# Patient Record
Sex: Male | Born: 1970 | Race: White | Hispanic: No | Marital: Single | Smoking: Former smoker
Health system: Southern US, Community
[De-identification: ages and names within clinical notes are randomized; demographics above are authoritative.]

## PROBLEM LIST (undated history)

## (undated) HISTORY — PX: KNEE SURGERY: SHX244

---

## 2008-11-12 ENCOUNTER — Emergency Department (HOSPITAL_COMMUNITY): Admission: EM | Admit: 2008-11-12 | Discharge: 2008-11-12 | Payer: Self-pay | Admitting: Emergency Medicine

## 2011-03-12 ENCOUNTER — Ambulatory Visit (INDEPENDENT_AMBULATORY_CARE_PROVIDER_SITE_OTHER): Payer: Self-pay | Admitting: Family Medicine

## 2011-03-12 ENCOUNTER — Encounter: Payer: Self-pay | Admitting: Family Medicine

## 2011-03-12 VITALS — BP 120/80 | HR 82 | Wt 185.0 lb

## 2011-03-12 DIAGNOSIS — F411 Generalized anxiety disorder: Secondary | ICD-10-CM

## 2011-03-12 DIAGNOSIS — F419 Anxiety disorder, unspecified: Secondary | ICD-10-CM

## 2011-03-12 MED ORDER — MIRTAZAPINE 7.5 MG PO TABS: 7.5000 mg | ORAL_TABLET | Freq: Every day | ORAL | Status: AC

## 2011-03-12 NOTE — Progress Notes (Signed)
  Subjective:    Patient ID: Allen Benson, male    DOB: 1970-11-26, 40 y.o.   MRN: [id number]  HPI He is here for consult concerning difficulty with anxiety. He relates difficulty with anxiety dating back to high school. He is also had panic attacks. He admits to having difficulty occasionally with sleep disturbance. There does seem to be a slightly cyclic nature to this. He states that he has a hard time shutting his mind off and this sometimes interferes with sleep. He sometimes only 3 or 4 hours of sleep and then crashed. He has a hard time staying focused. In the past he had tried cocaine and found that it did occasionally make him more focused. In the past he had been given Remeron which she did say helped with sleep.   Review of Systems     Objective:   Physical Exam Alert and in no distress. Birthmark present on upper mid lip. Multiple tattoos are noted. He is appropriately dressed.       Assessment & Plan:  Symptoms suggestive of anxiety as well as anger management issues. There is a question of bipolar disorder. I will start him out initially with low-dose Remeron to see how this will do. He is aware that his diagnosis is not clear-cut. He will call me in one month and let me know he is doing.

## 2011-03-12 NOTE — Patient Instructions (Signed)
Try the Remeron and let me know how it works. Call me in a week

## 2011-04-16 ENCOUNTER — Encounter (HOSPITAL_COMMUNITY): Payer: Self-pay | Admitting: Radiology

## 2011-04-16 ENCOUNTER — Emergency Department (HOSPITAL_COMMUNITY)
Admission: EM | Admit: 2011-04-16 | Discharge: 2011-04-16 | Disposition: A | Discharge status: Home / Self Care | Payer: Self-pay | Attending: Emergency Medicine | Admitting: Emergency Medicine

## 2011-04-16 ENCOUNTER — Emergency Department (HOSPITAL_COMMUNITY): Payer: Self-pay

## 2011-04-16 DIAGNOSIS — K5289 Other specified noninfective gastroenteritis and colitis: Secondary | ICD-10-CM | POA: Insufficient documentation

## 2011-04-16 DIAGNOSIS — R109 Unspecified abdominal pain: Secondary | ICD-10-CM | POA: Insufficient documentation

## 2011-04-16 LAB — URINE MICROSCOPIC-ADD ON

## 2011-04-16 LAB — BASIC METABOLIC PANEL
CO2: 23 mEq/L (ref 19–32)
Chloride: 103 mEq/L (ref 96–112)
Creatinine, Ser: 0.96 mg/dL (ref 0.50–1.35)
Glucose, Bld: 103 mg/dL — ABNORMAL HIGH (ref 70–99)

## 2011-04-16 LAB — URINALYSIS, ROUTINE W REFLEX MICROSCOPIC
Glucose, UA: NEGATIVE mg/dL
Hgb urine dipstick: NEGATIVE
Protein, ur: 30 mg/dL — AB
pH: 6 (ref 5.0–8.0)

## 2011-04-16 LAB — CBC
HCT: 44.4 % (ref 39.0–52.0)
Hemoglobin: 15.2 g/dL (ref 13.0–17.0)
MCH: 31 pg (ref 26.0–34.0)
MCHC: 34.2 g/dL (ref 30.0–36.0)
RBC: 4.9 MIL/uL (ref 4.22–5.81)

## 2011-04-16 LAB — DIFFERENTIAL
Lymphocytes Relative: 21 % (ref 12–46)
Lymphs Abs: 1.5 10*3/uL (ref 0.7–4.0)
Monocytes Absolute: 0.7 10*3/uL (ref 0.1–1.0)
Monocytes Relative: 9 % (ref 3–12)
Neutro Abs: 5.2 10*3/uL (ref 1.7–7.7)
Neutrophils Relative %: 70 % (ref 43–77)

## 2011-04-16 MED ORDER — IOHEXOL 300 MG/ML  SOLN
100.0000 mL | Freq: Once | INTRAMUSCULAR | Status: AC | PRN
  Administered 2011-04-16: 100 mL via INTRAVENOUS

## 2011-11-12 IMAGING — CT CT ABD-PELV W/ CM
1 series · 16 of 32 positions shown, 20 images · IV contrast (omnipaque)
Comparison: None.

CLINICAL DATA: Abdominal pain, diarrhea, fever.

CT ABDOMEN AND PELVIS WITH CONTRAST
TECHNIQUE: Multidetector CT imaging of the abdomen and pelvis was
performed following the standard protocol during bolus
administration of intravenous contrast.
Contrast:  100 ml Omnipaque 300 IV.

[Series 2: rtn ap with st · axial · 0.66mm/px · z∈[-495,-85]mm · 16 of 91 slices shown, 20 images]
[im 6/91  soft-tissue]
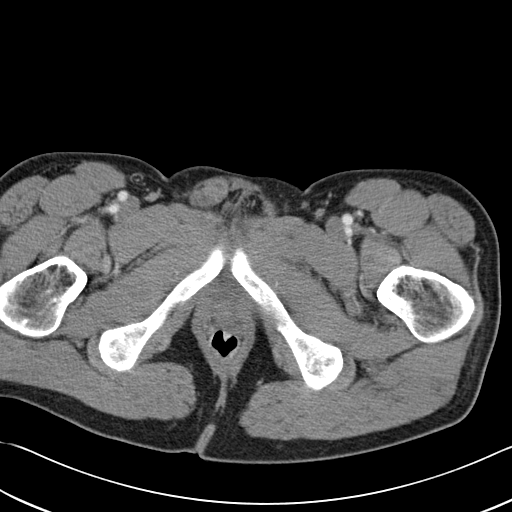
[im 6/91  bone]
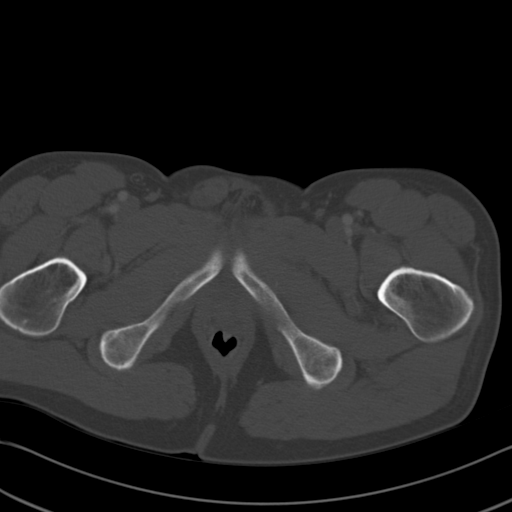
[im 12/91  soft-tissue]
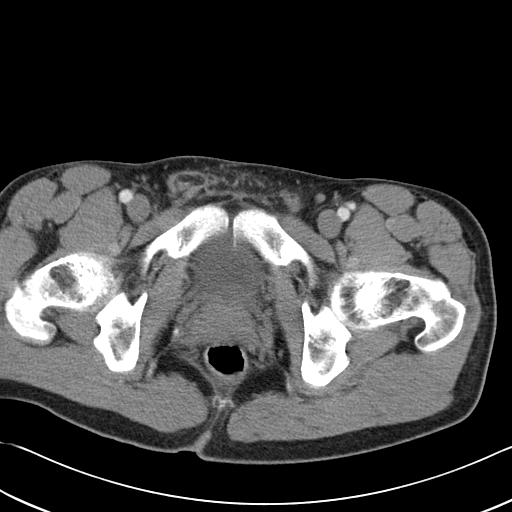
[im 18/91  soft-tissue]
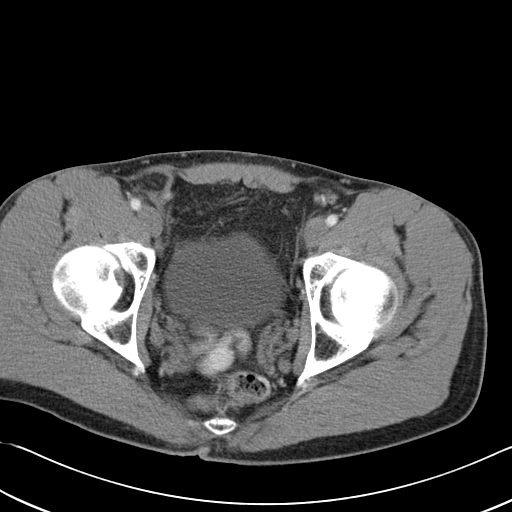
[im 24/91  soft-tissue]
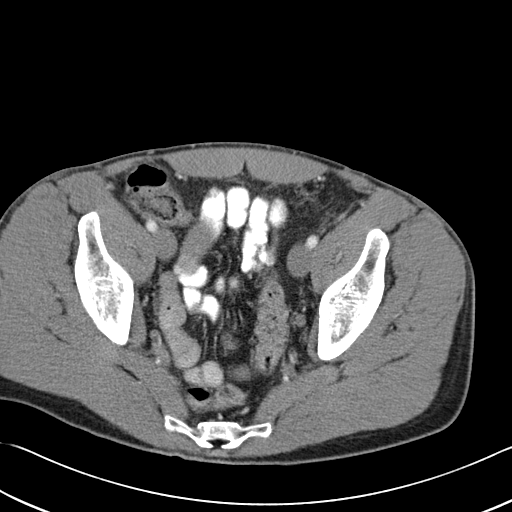
[im 30/91  soft-tissue]
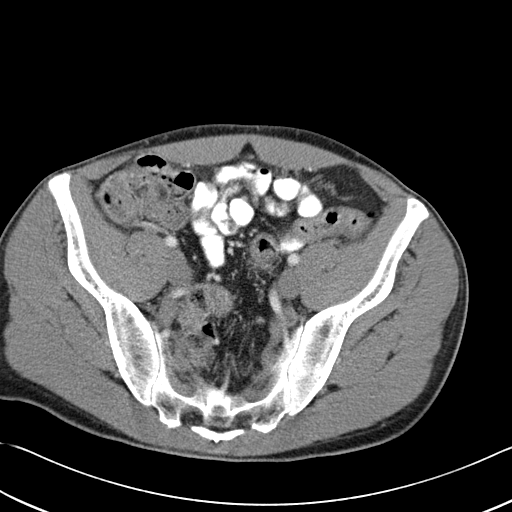
[im 35/91  soft-tissue]
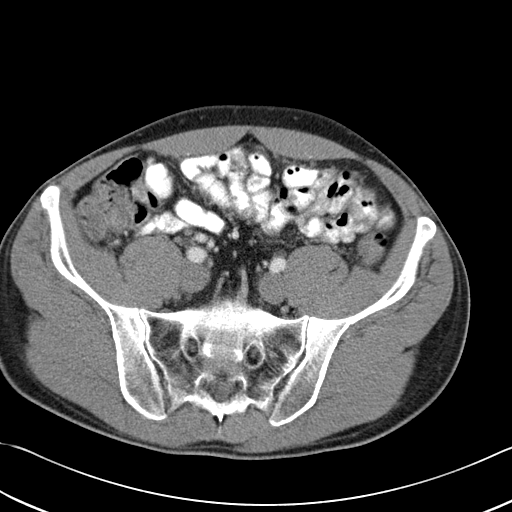
[im 41/91  soft-tissue]
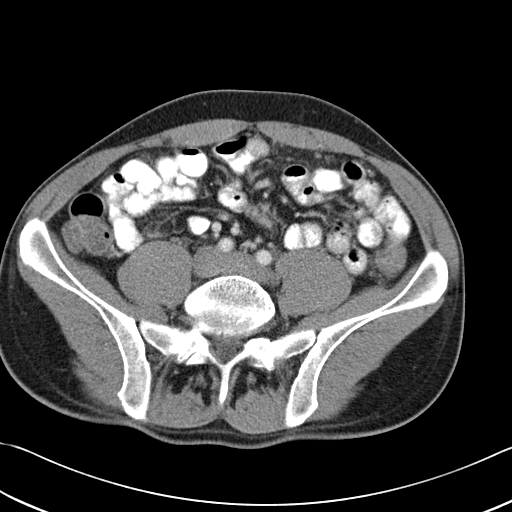
[im 50/91  soft-tissue]
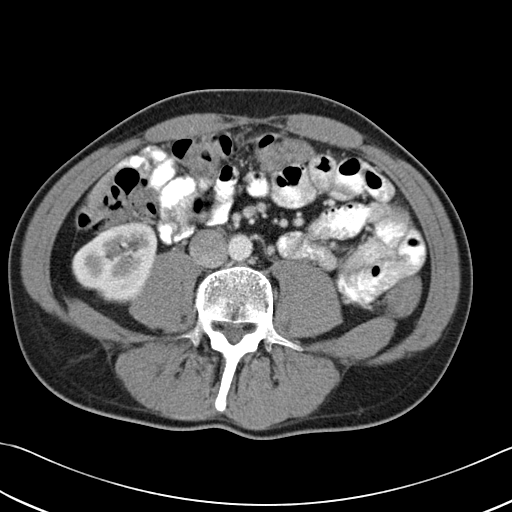
[im 56/91  soft-tissue]
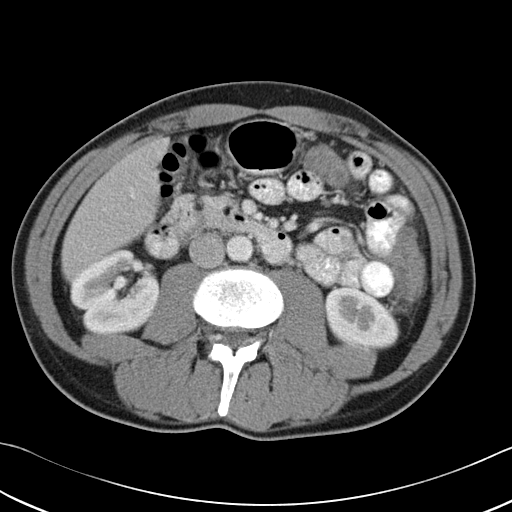
[im 56/91  bone]
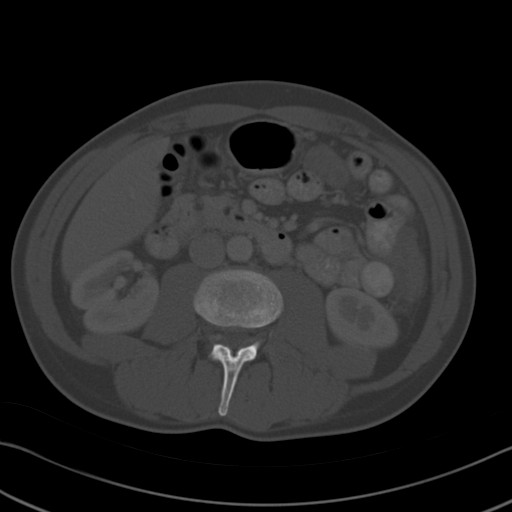
[im 61/91  soft-tissue]
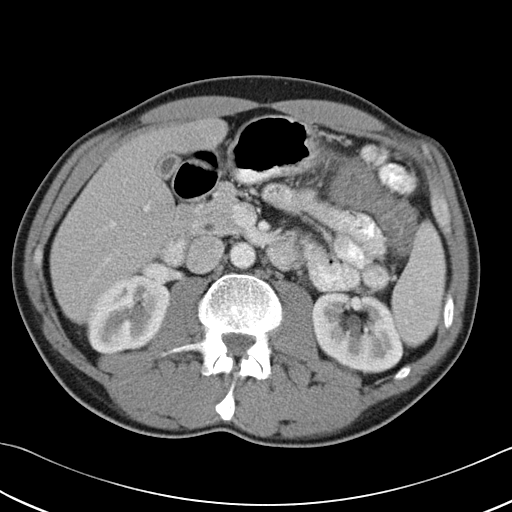
[im 67/91  soft-tissue]
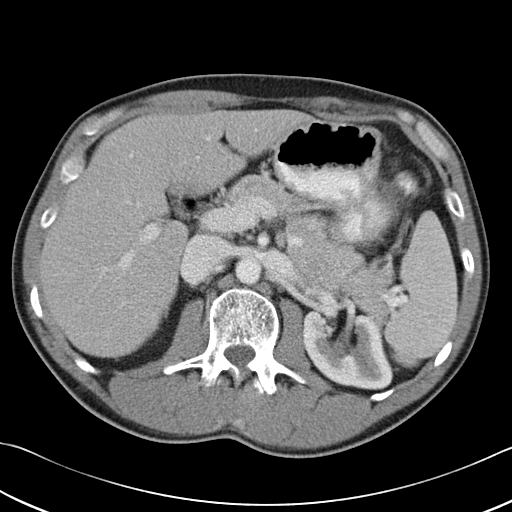
[im 73/91  soft-tissue]
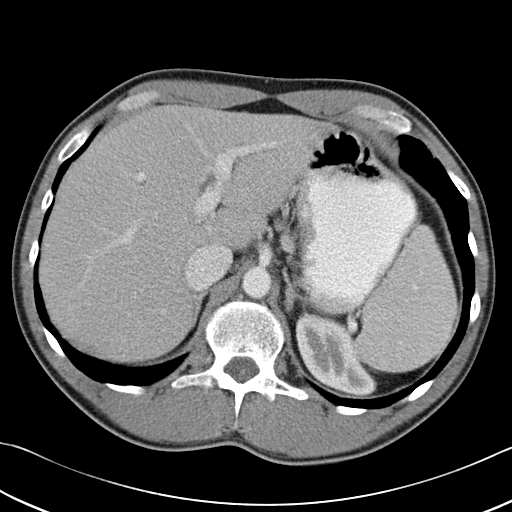
[im 79/91  soft-tissue]
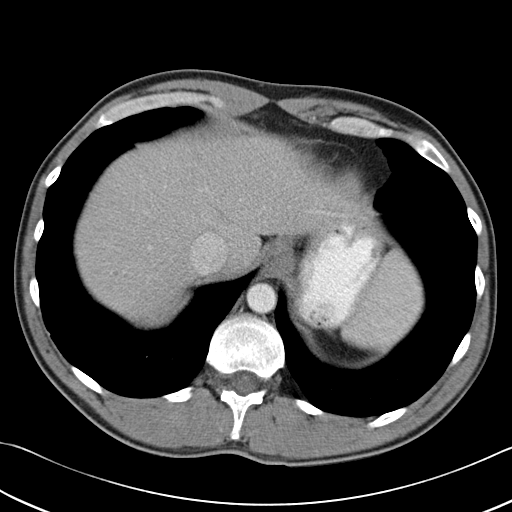
[im 79/91  lung]
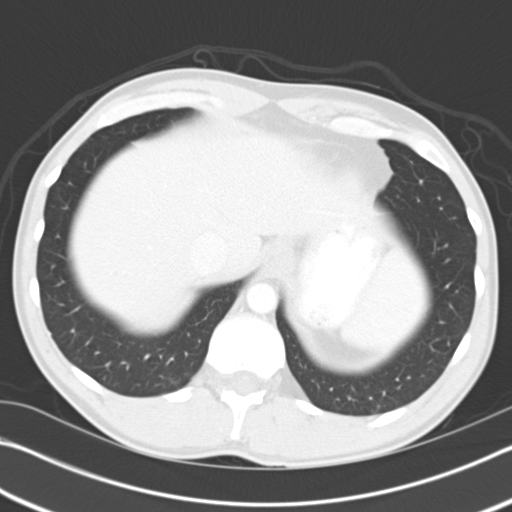
[im 82/91  lung]
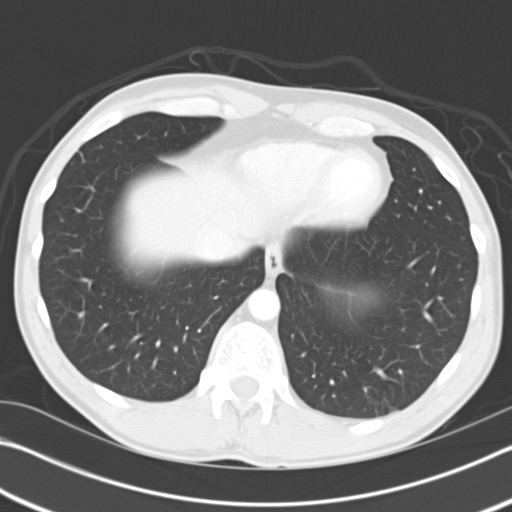
[im 85/91  soft-tissue]
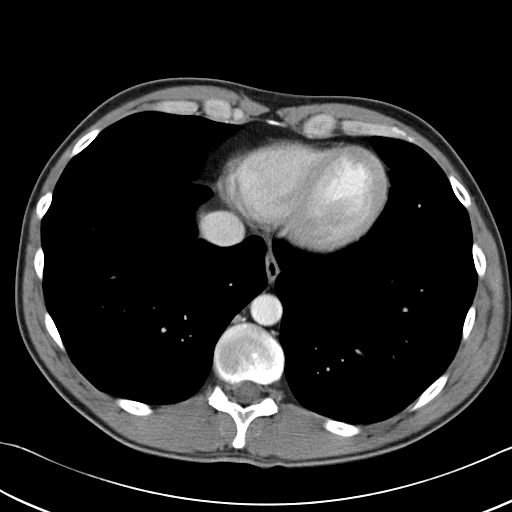
[im 85/91  lung]
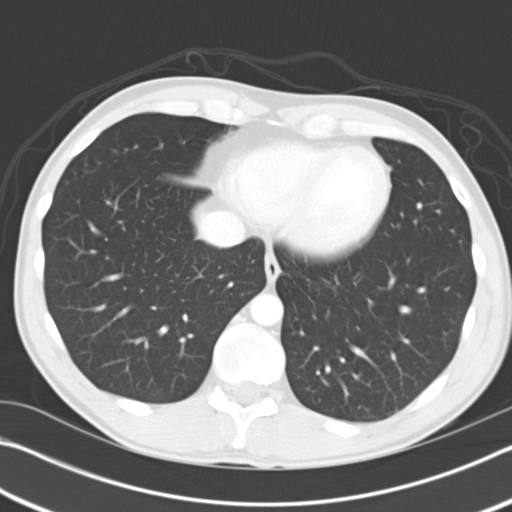
[im 88/91  lung]
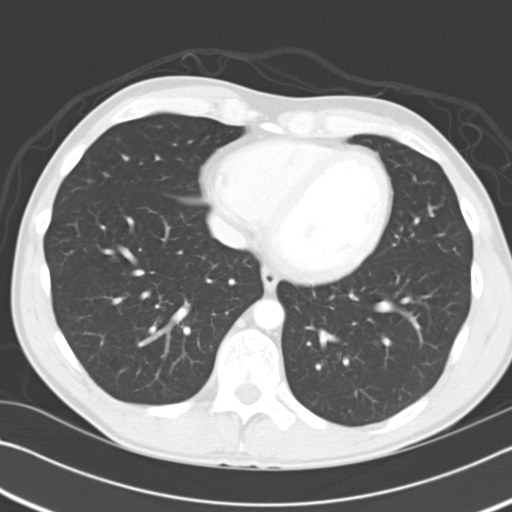

[16 of 32 positions shown; findings below may reference images not displayed]

FINDINGS: Lung bases are clear.  No effusions.  Heart is normal
size.

Tiny low density lesion centrally within the right hepatic lobe,
too small to characterize but most likely a small cyst.
Gallbladder is contracted.  Spleen, pancreas, adrenals and kidneys
are unremarkable.

There is wall thickening with mild surrounding inflammatory change
involving the distal transverse colon, splenic flexure and proximal
descending colon compatible with infectious or inflammatory
colitis.  No free fluid or free air.  No abscess.

Urinary bladder is unremarkable.  Small bowel is decompressed.
IMPRESSION: Evidence of colitis in the distal transverse colon, splenic flexure
and proximal descending colon, likely infectious or inflammatory.

## 2012-09-04 ENCOUNTER — Emergency Department (HOSPITAL_COMMUNITY): Payer: Self-pay

## 2012-09-04 ENCOUNTER — Emergency Department (HOSPITAL_COMMUNITY)
Admission: EM | Admit: 2012-09-04 | Discharge: 2012-09-04 | Disposition: A | Discharge status: Home / Self Care | Payer: Self-pay | Attending: Emergency Medicine | Admitting: Emergency Medicine

## 2012-09-04 ENCOUNTER — Encounter (HOSPITAL_COMMUNITY): Payer: Self-pay | Admitting: Cardiology

## 2012-09-04 DIAGNOSIS — E119 Type 2 diabetes mellitus without complications: Secondary | ICD-10-CM | POA: Insufficient documentation

## 2012-09-04 DIAGNOSIS — I10 Essential (primary) hypertension: Secondary | ICD-10-CM | POA: Insufficient documentation

## 2012-09-04 DIAGNOSIS — H6690 Otitis media, unspecified, unspecified ear: Secondary | ICD-10-CM | POA: Insufficient documentation

## 2012-09-04 DIAGNOSIS — H6692 Otitis media, unspecified, left ear: Secondary | ICD-10-CM

## 2012-09-04 MED ORDER — HYDROCODONE-ACETAMINOPHEN 7.5-500 MG/15ML PO SOLN: 15.0000 mL | Freq: Four times a day (QID) | ORAL | Status: DC | PRN

## 2012-09-04 MED ORDER — AMOXICILLIN-POT CLAVULANATE 875-125 MG PO TABS: 1.0000 | ORAL_TABLET | Freq: Two times a day (BID) | ORAL | Status: AC

## 2012-09-04 NOTE — ED Notes (Signed)
Pt reports fever, cough and SOB over the past couple of days. States he had some n/v prior to christmas, but no vomiting at this time.

## 2012-09-04 NOTE — ED Provider Notes (Signed)
History   This chart was scribed for Donnetta Hutching, MD by Sofie Rower, ED Scribe. The patient was seen in room TR08C/TR08C and the patient's care was started at 6:32PM.    CSN: [id number]  Arrival date & time 09/04/12  4098   First MD Initiated Contact with Patient 09/04/12 1832      Chief Complaint  Patient presents with  . Fever  . Cough    (Consider location/radiation/quality/duration/timing/severity/associated sxs/prior treatment) The history is provided by the patient. No language interpreter was used.    AYAANSH SMAIL is a 41 y.o. male , with a hx of diabetes and hypertension, who presents to the Emergency Department complaining of gradual, progressively worsening, non productive cough, onset two days ago (09/02/12).  Associated symptoms include otalgia, headache, shortness of breath, and fever (unknown maximum temperature prior to arrival). The pt reports he has been coughing to the point to where he has begun to generate a moderate headache. The pt has taken Day-quil which does not provide relief of the cough. Modifying factors include deep breathing which intensifies the cough. The pt has a hx of allergy to codeine.   The pt does not smoke or drink alcohol.     History reviewed. No pertinent past medical history.  History reviewed. No pertinent past surgical history.  History reviewed. No pertinent family history.  History  Substance Use Topics  . Smoking status: Never Smoker   . Smokeless tobacco: Never Used  . Alcohol Use: No      Review of Systems  All other systems reviewed and are negative.    Allergies  Codeine  Home Medications   Current Outpatient Rx  Name  Route  Sig  Dispense  Refill  . PSEUDOEPHEDRINE-APAP-DM 11-914-78 MG/30ML PO LIQD   Oral   Take 30 mLs by mouth every 8 (eight) hours as needed. For pain           BP 123/82  Pulse 88  Temp 98.8 F (37.1 C) (Oral)  Resp 18  SpO2 98%  Physical Exam  Nursing note and vitals  reviewed. Constitutional: He is oriented to person, place, and time. He appears well-developed and well-nourished.  HENT:  Head: Atraumatic.  Right Ear: Tympanic membrane normal.  Left Ear: Tympanic membrane is erythematous.  Nose: Nose normal.  Pulmonary/Chest: Effort normal and breath sounds normal.  Neurological: He is alert and oriented to person, place, and time.  Skin: Skin is warm and dry.  Psychiatric: He has a normal mood and affect.    ED Course  Procedures (including critical care time)  DIAGNOSTIC STUDIES: Oxygen Saturation is 98% on room air, normal by my interpretation.    COORDINATION OF CARE:  7:04 PM- Treatment plan discussed with patient. Pt agrees with treatment.      Labs Reviewed - No data to display  Dg Chest 2 View  09/04/2012  *RADIOLOGY REPORT*  Clinical Data: Fever and cough  CHEST - 2 VIEW  Comparison: None  Findings: The heart size and mediastinal contours are within normal limits.  Both lungs are clear.  The visualized skeletal structures are unremarkable.  IMPRESSION: No active cardiopulmonary abnormalities.   Original Report Authenticated By: Signa Kell, M.D.       No diagnosis found.    MDM  Rx Augmentin 875 for 10 days and Lortab elixir 150 mL      I personally performed the services described in this documentation, which was scribed in my presence. The recorded information has been reviewed  and is accurate.    Donnetta Hutching, MD 09/04/12 1946

## 2012-09-06 ENCOUNTER — Encounter (HOSPITAL_COMMUNITY): Payer: Self-pay | Admitting: *Deleted

## 2012-09-06 ENCOUNTER — Emergency Department (HOSPITAL_COMMUNITY)
Admission: EM | Admit: 2012-09-06 | Discharge: 2012-09-06 | Disposition: A | Discharge status: Home / Self Care | Payer: Self-pay | Attending: Emergency Medicine | Admitting: Emergency Medicine

## 2012-09-06 DIAGNOSIS — R51 Headache: Secondary | ICD-10-CM | POA: Insufficient documentation

## 2012-09-06 DIAGNOSIS — R112 Nausea with vomiting, unspecified: Secondary | ICD-10-CM | POA: Insufficient documentation

## 2012-09-06 DIAGNOSIS — R509 Fever, unspecified: Secondary | ICD-10-CM | POA: Insufficient documentation

## 2012-09-06 DIAGNOSIS — J029 Acute pharyngitis, unspecified: Secondary | ICD-10-CM | POA: Insufficient documentation

## 2012-09-06 DIAGNOSIS — H6690 Otitis media, unspecified, unspecified ear: Secondary | ICD-10-CM | POA: Insufficient documentation

## 2012-09-06 DIAGNOSIS — R05 Cough: Secondary | ICD-10-CM | POA: Insufficient documentation

## 2012-09-06 DIAGNOSIS — R0789 Other chest pain: Secondary | ICD-10-CM | POA: Insufficient documentation

## 2012-09-06 DIAGNOSIS — H538 Other visual disturbances: Secondary | ICD-10-CM | POA: Insufficient documentation

## 2012-09-06 DIAGNOSIS — R059 Cough, unspecified: Secondary | ICD-10-CM | POA: Insufficient documentation

## 2012-09-06 DIAGNOSIS — Z87891 Personal history of nicotine dependence: Secondary | ICD-10-CM | POA: Insufficient documentation

## 2012-09-06 LAB — CBC WITH DIFFERENTIAL/PLATELET
Eosinophils Relative: 0 % (ref 0–5)
HCT: 46.9 % (ref 39.0–52.0)
Lymphocytes Relative: 15 % (ref 12–46)
Lymphs Abs: 1.6 10*3/uL (ref 0.7–4.0)
MCH: 30.5 pg (ref 26.0–34.0)
MCV: 87.7 fL (ref 78.0–100.0)
Monocytes Absolute: 0.7 10*3/uL (ref 0.1–1.0)
RBC: 5.35 MIL/uL (ref 4.22–5.81)
RDW: 12.9 % (ref 11.5–15.5)
WBC: 10.8 10*3/uL — ABNORMAL HIGH (ref 4.0–10.5)

## 2012-09-06 LAB — BASIC METABOLIC PANEL
BUN: 15 mg/dL (ref 6–23)
CO2: 24 mEq/L (ref 19–32)
Calcium: 9.2 mg/dL (ref 8.4–10.5)
Creatinine, Ser: 1.14 mg/dL (ref 0.50–1.35)
Glucose, Bld: 126 mg/dL — ABNORMAL HIGH (ref 70–99)

## 2012-09-06 MED ORDER — ONDANSETRON HCL 4 MG PO TABS: 4.0000 mg | ORAL_TABLET | Freq: Four times a day (QID) | ORAL | Status: AC

## 2012-09-06 MED ORDER — ANTIPYRINE-BENZOCAINE 5.4-1.4 % OT SOLN
3.0000 [drp] | Freq: Once | OTIC | Status: AC
  Administered 2012-09-06: 4 [drp] via OTIC
  Filled 2012-09-06: qty 10

## 2012-09-06 MED ORDER — AMOXICILLIN-POT CLAVULANATE 875-125 MG PO TABS
1.0000 | ORAL_TABLET | Freq: Once | ORAL | Status: AC
  Administered 2012-09-06: 1 via ORAL
  Filled 2012-09-06: qty 1

## 2012-09-06 MED ORDER — IBUPROFEN 800 MG PO TABS: 800.0000 mg | ORAL_TABLET | Freq: Three times a day (TID) | ORAL | Status: AC

## 2012-09-06 MED ORDER — KETOROLAC TROMETHAMINE 60 MG/2ML IM SOLN
60.0000 mg | Freq: Once | INTRAMUSCULAR | Status: AC
  Administered 2012-09-06: 60 mg via INTRAMUSCULAR
  Filled 2012-09-06: qty 2

## 2012-09-06 MED ORDER — ONDANSETRON 4 MG PO TBDP
8.0000 mg | ORAL_TABLET | Freq: Once | ORAL | Status: AC
  Administered 2012-09-06: 8 mg via ORAL
  Filled 2012-09-06: qty 2

## 2012-09-06 MED ORDER — OXYCODONE-ACETAMINOPHEN 5-325 MG PO TABS
1.0000 | ORAL_TABLET | Freq: Once | ORAL | Status: AC
  Administered 2012-09-06: 1 via ORAL
  Filled 2012-09-06: qty 1

## 2012-09-06 NOTE — ED Notes (Signed)
Pt did not answer when called for room 

## 2012-09-06 NOTE — ED Notes (Signed)
C/o headache rated as a 10/10 on pain scale. States he has had a cough and cold symptoms for several days and temp of 103. Was seen her but did not have money to get prescriptions filled.

## 2012-09-06 NOTE — ED Notes (Signed)
Call and no answer

## 2012-09-06 NOTE — ED Provider Notes (Signed)
Medical screening examination/treatment/procedure(s) were performed by non-physician practitioner and as supervising physician I was immediately available for consultation/collaboration.   Annette Bertelson, MD 09/06/12 2335 

## 2012-09-06 NOTE — ED Provider Notes (Signed)
History  Scribed for Remi Haggard, FNP, Dorian Furnace, MD, the patient was seen in room TR06C/TR06C. This chart was scribed by Candelaria Stagers. The patient's care started at 7:28 PM   CSN: [id number]  Arrival date & time 09/06/12  1703   None     No chief complaint on file.   The history is provided by the patient. No language interpreter was used.   Allen Benson is a 42 y.o. male who presents to the Emergency Department complaining of a headache, sore throat, and bilateral ear pain that started about one week ago.  Pt is also experiencing nausea associated with the headache, intermittent fever, cough, and chest pain.  Pt was seen in the ED three days ago and was prescribed antibiotics.  He was not able to fill the prescription due to cost.   Pt denies ill contacts.  He has taken Excedrin and tylenol with no relief.  Pt has h/o ear infections.       History reviewed. No pertinent past medical history.  History reviewed. No pertinent past surgical history.  No family history on file.  History  Substance Use Topics  . Smoking status: Former Games developer  . Smokeless tobacco: Never Used  . Alcohol Use: No      Review of Systems  Constitutional: Positive for fever and chills.  HENT: Positive for ear pain (bilateral ear pain) and sore throat.   Eyes: Positive for visual disturbance (blurred vision).  Respiratory: Positive for cough and chest tightness.   Gastrointestinal: Positive for nausea and vomiting. Negative for abdominal pain and diarrhea.  Neurological: Positive for headaches. Negative for dizziness.  All other systems reviewed and are negative.    Allergies  Codeine  Home Medications   Current Outpatient Rx  Name  Route  Sig  Dispense  Refill  . AMOXICILLIN-POT CLAVULANATE 875-125 MG PO TABS   Oral   Take 1 tablet by mouth 2 (two) times daily. One po bid x 7 days   20 tablet   0   . HYDROCODONE-ACETAMINOPHEN 7.5-500 MG/15ML PO SOLN   Oral   Take 15 mLs by mouth  every 6 (six) hours as needed for pain.   150 mL   0   . PSEUDOEPHEDRINE-APAP-DM 60-650-20 MG/30ML PO LIQD   Oral   Take 30 mLs by mouth every 8 (eight) hours as needed. For pain           BP 153/88  Pulse 105  Temp 102.2 F (39 C) (Oral)  Resp 22  SpO2 95%  Physical Exam  Nursing note and vitals reviewed. Constitutional: He is oriented to person, place, and time. He appears well-developed and well-nourished. No distress.  HENT:  Head: Normocephalic and atraumatic.  Right Ear: Tympanic membrane normal.  Left Ear: Tympanic membrane is injected and erythematous. A middle ear effusion is present.  Mouth/Throat: No oropharyngeal exudate.       Throat erythema   Eyes: EOM are normal.  Neck: Neck supple. No tracheal deviation present.  Cardiovascular: Normal rate.   Pulmonary/Chest: Effort normal and breath sounds normal. No respiratory distress. He has no wheezes.  Musculoskeletal: Normal range of motion.  Neurological: He is alert and oriented to person, place, and time.       Normal and equal strength bilaterally.    Skin: Skin is warm and dry. He is not diaphoretic.  Psychiatric: He has a normal mood and affect. His behavior is normal.    ED Course  Procedures  DIAGNOSTIC STUDIES: Oxygen Saturation is 95% on room air, normal by my interpretation.    COORDINATION OF CARE:  17:17 Ordered: CBC with Differential; Basic metabolic panel; Rapid strep screen  7:34 PM Will give nausea medication, antiinflammatory, and prescribe a Z-pack.  Pt understands and agrees.    Labs Reviewed  CBC WITH DIFFERENTIAL - Abnormal; Notable for the following:    WBC 10.8 (*)     Neutrophils Relative 79 (*)     Neutro Abs 8.5 (*)     All other components within normal limits  BASIC METABOLIC PANEL - Abnormal; Notable for the following:    Glucose, Bld 126 (*)     GFR calc non Af Amer 78 (*)     All other components within normal limits  RAPID STREP SCREEN   No results found.   No  diagnosis found.    MDM  Fever, sore throat and otitis media L Did not fill rx because he could not afford it.  He will get his mother to pay for it tomorrow.  First dose given in the eR.  Patient is ready for discharge. WBC 10.8.   I personally performed the services described in this documentation, which was scribed in my presence. The recorded information has been reviewed and is accurate. Labs Reviewed  CBC WITH DIFFERENTIAL - Abnormal; Notable for the following:    WBC 10.8 (*)     Neutrophils Relative 79 (*)     Neutro Abs 8.5 (*)     All other components within normal limits  BASIC METABOLIC PANEL - Abnormal; Notable for the following:    Glucose, Bld 126 (*)     GFR calc non Af Amer 78 (*)     All other components within normal limits  RAPID STREP SCREEN         Remi Haggard, NP 09/06/12 2158

## 2012-09-06 NOTE — ED Notes (Signed)
Called to room, pt c/o feeling hot and sweaty. Temp rechecked. Gave pt cool washcloth for head.

## 2012-09-06 NOTE — ED Notes (Signed)
Pt is here with bilateral ear pain, sorethroat, temp max 103, lungs hurt for the entire week.  Pt states that he could not get cough syrup or antibiotic

## 2015-04-20 ENCOUNTER — Encounter (HOSPITAL_COMMUNITY): Payer: Self-pay | Admitting: Emergency Medicine

## 2015-04-20 ENCOUNTER — Emergency Department (HOSPITAL_COMMUNITY)
Admission: EM | Admit: 2015-04-20 | Discharge: 2015-04-20 | Disposition: A | Discharge status: Home / Self Care | Payer: Managed Care, Other (non HMO) | Attending: Emergency Medicine | Admitting: Emergency Medicine

## 2015-04-20 DIAGNOSIS — L03116 Cellulitis of left lower limb: Secondary | ICD-10-CM | POA: Insufficient documentation

## 2015-04-20 DIAGNOSIS — Z791 Long term (current) use of non-steroidal anti-inflammatories (NSAID): Secondary | ICD-10-CM | POA: Diagnosis not present

## 2015-04-20 DIAGNOSIS — M25562 Pain in left knee: Secondary | ICD-10-CM | POA: Diagnosis present

## 2015-04-20 DIAGNOSIS — Z87891 Personal history of nicotine dependence: Secondary | ICD-10-CM | POA: Diagnosis not present

## 2015-04-20 MED ORDER — SULFAMETHOXAZOLE-TRIMETHOPRIM 800-160 MG PO TABS: 1.0000 | ORAL_TABLET | Freq: Two times a day (BID) | ORAL | Status: AC

## 2015-04-20 MED ORDER — SULFAMETHOXAZOLE-TRIMETHOPRIM 800-160 MG PO TABS
1.0000 | ORAL_TABLET | Freq: Once | ORAL | Status: AC
  Administered 2015-04-20: 1 via ORAL
  Filled 2015-04-20: qty 1

## 2015-04-20 MED ORDER — HYDROCODONE-ACETAMINOPHEN 5-325 MG PO TABS: ORAL_TABLET | ORAL | Status: AC

## 2015-04-20 NOTE — ED Provider Notes (Signed)
CSN: [id number]     Arrival date & time 04/20/15  2234 History   First MD Initiated Contact with Patient 04/20/15 2249     Chief Complaint  Patient presents with  . Insect Bite     (Consider location/radiation/quality/duration/timing/severity/associated sxs/prior Treatment) HPI   Benson Benson is a 44 y.o. male who presents to the Emergency Department complaining of pain and redness to the medial left knee.  He states he noticed the symptoms two days ago and thinks he may have been bitten by something.  He states the redness has increased since onset.  Pain with weight bearing, resolves at rest.  He has applied neosporin without relief.  He denies swelling, fever, chills, numbness or weakness of the extremity.     History reviewed. No pertinent past medical history. History reviewed. No pertinent past surgical history. History reviewed. No pertinent family history. Social History  Substance Use Topics  . Smoking status: Former Games developer  . Smokeless tobacco: Never Used  . Alcohol Use: No    Review of Systems  Constitutional: Negative for fever and chills.  Gastrointestinal: Negative for nausea and vomiting.  Musculoskeletal: Positive for myalgias. Negative for joint swelling.  Skin: Positive for color change and wound.  All other systems reviewed and are negative.     Allergies  Codeine  Home Medications   Prior to Admission medications   Medication Sig Start Date End Date Taking? Authorizing Provider  amoxicillin-clavulanate (AUGMENTIN) 875-125 MG per tablet Take 1 tablet by mouth 2 (two) times daily. One po bid x 7 days 09/04/12   Donnetta Hutching, MD  HYDROcodone-acetaminophen (LORTAB) 7.5-500 MG/15ML solution Take 15 mLs by mouth every 6 (six) hours as needed for pain. 09/04/12   Donnetta Hutching, MD  ibuprofen (ADVIL,MOTRIN) 800 MG tablet Take 1 tablet (800 mg total) by mouth 3 (three) times daily. 09/06/12   Jethro Bastos, NP  ondansetron (ZOFRAN) 4 MG tablet Take 1 tablet (4 mg  total) by mouth every 6 (six) hours. 09/06/12   Jethro Bastos, NP   BP 136/81 mmHg  Pulse 98  Temp(Src) 98.6 F (37 C) (Oral)  Resp 16  Ht 6\' 4"  (1.93 m)  Wt 215 lb (97.523 kg)  BMI 26.18 kg/m2  SpO2 97% Physical Exam  Constitutional: He is oriented to person, place, and time. He appears well-developed and well-nourished. No distress.  HENT:  Head: Normocephalic and atraumatic.  Cardiovascular: Normal rate, regular rhythm and normal heart sounds.   No murmur heard. Pulmonary/Chest: Effort normal and breath sounds normal. No respiratory distress.  Musculoskeletal: Normal range of motion. He exhibits no edema.  Pt has full ROM of the left knee.    Neurological: He is alert and oriented to person, place, and time. He exhibits normal muscle tone. Coordination normal.  Skin: Skin is warm and dry. There is erythema.  Localized area of erythema medial to the left knee.  No induration, fluctuance or drainage.     Nursing note and vitals reviewed.   ED Course  Procedures (including critical care time) Labs Review Labs Reviewed - No data to display  Imaging Review No results found.     EKG Interpretation None      MDM   Final diagnoses:  Cellulitis of left lower extremity    Pt with localized reaction to possible insect bite.  No abscess at present, symptoms do not extend to the joint.  Pt agrees to elevate, minimal weight bearing and advised to return here in 1-2  days for recheck if not improving  Leading edge of erythema marked by me.    Pauline Aus, PA-C 04/20/15 2328  Mancel Bale, MD 04/23/15 [phone]

## 2015-04-20 NOTE — Discharge Instructions (Signed)

## 2015-04-20 NOTE — ED Notes (Signed)
Possible insect bite to left knee. Pt thinks he was bit by spider on Friday. States he has "lots of pain" and has to go back to work tomorrow.

## 2015-04-23 ENCOUNTER — Emergency Department (HOSPITAL_COMMUNITY)
Admission: EM | Admit: 2015-04-23 | Discharge: 2015-04-23 | Disposition: A | Discharge status: Home / Self Care | Payer: PRIVATE HEALTH INSURANCE | Attending: Emergency Medicine | Admitting: Emergency Medicine

## 2015-04-23 ENCOUNTER — Encounter (HOSPITAL_COMMUNITY): Payer: Self-pay

## 2015-04-23 DIAGNOSIS — Z4800 Encounter for change or removal of nonsurgical wound dressing: Secondary | ICD-10-CM | POA: Diagnosis present

## 2015-04-23 DIAGNOSIS — L02416 Cutaneous abscess of left lower limb: Secondary | ICD-10-CM | POA: Insufficient documentation

## 2015-04-23 DIAGNOSIS — L03116 Cellulitis of left lower limb: Secondary | ICD-10-CM | POA: Diagnosis not present

## 2015-04-23 DIAGNOSIS — Z87891 Personal history of nicotine dependence: Secondary | ICD-10-CM | POA: Diagnosis not present

## 2015-04-23 DIAGNOSIS — Z791 Long term (current) use of non-steroidal anti-inflammatories (NSAID): Secondary | ICD-10-CM | POA: Insufficient documentation

## 2015-04-23 DIAGNOSIS — L0291 Cutaneous abscess, unspecified: Secondary | ICD-10-CM

## 2015-04-23 MED ORDER — OXYCODONE HCL 5 MG PO TABS
5.0000 mg | ORAL_TABLET | Freq: Once | ORAL | Status: AC
  Administered 2015-04-23: 5 mg via ORAL
  Filled 2015-04-23: qty 1

## 2015-04-23 MED ORDER — IBUPROFEN 800 MG PO TABS
800.0000 mg | ORAL_TABLET | Freq: Once | ORAL | Status: AC
  Administered 2015-04-23: 800 mg via ORAL
  Filled 2015-04-23: qty 1

## 2015-04-23 MED ORDER — POVIDONE-IODINE 10 % EX SOLN
CUTANEOUS | Status: AC
  Filled 2015-04-23: qty 118

## 2015-04-23 MED ORDER — ACETAMINOPHEN 500 MG PO TABS
1000.0000 mg | ORAL_TABLET | Freq: Once | ORAL | Status: AC
  Administered 2015-04-23: 1000 mg via ORAL
  Filled 2015-04-23: qty 2

## 2015-04-23 MED ORDER — LIDOCAINE-EPINEPHRINE (PF) 1 %-1:200000 IJ SOLN
10.0000 mL | Freq: Once | INTRAMUSCULAR | Status: AC
  Administered 2015-04-23: 10 mL via INTRADERMAL
  Filled 2015-04-23: qty 10

## 2015-04-23 NOTE — Discharge Instructions (Signed)
Abscess °An abscess (boil or furuncle) is an infected area on or under the skin. This area is filled with yellowish-white fluid (pus) and other material (debris). °HOME CARE  °· Only take medicines as told by your doctor. °· If you were given antibiotic medicine, take it as directed. Finish the medicine even if you start to feel better. °· If gauze is used, follow your doctor's directions for changing the gauze. °· To avoid spreading the infection: °¨ Keep your abscess covered with a bandage. °¨ Wash your hands well. °¨ Do not share personal care items, towels, or whirlpools with others. °¨ Avoid skin contact with others. °· Keep your skin and clothes clean around the abscess. °· Keep all doctor visits as told. °GET HELP RIGHT AWAY IF:  °· You have more pain, puffiness (swelling), or redness in the wound site. °· You have more fluid or blood coming from the wound site. °· You have muscle aches, chills, or you feel sick. °· You have a fever. °MAKE SURE YOU:  °· Understand these instructions. °· Will watch your condition. °· Will get help right away if you are not doing well or get worse. °Document Released: 02/09/2008 Document Revised: 02/22/2012 Document Reviewed: 11/05/2011 °[employer]® Patient Information ©2015 [employer]. This information is not intended to replace advice given to you by your health care provider. Make sure you discuss any questions you have with your health care provider. ° °

## 2015-04-23 NOTE — ED Provider Notes (Signed)
CSN: [id number]     Arrival date & time 04/23/15  1439 History   First MD Initiated Contact with Patient 04/23/15 1502     Chief Complaint  Patient presents with  . Wound Check     (Consider location/radiation/quality/duration/timing/severity/associated sxs/prior Treatment) Patient is a 44 y.o. male presenting with abscess. The history is provided by the patient.  Abscess Location:  Leg Leg abscess location:  L lower leg Abscess quality: induration, painful and redness   Abscess quality: not draining   Red streaking: no   Duration:  4 days Progression:  Partially resolved Pain details:    Quality:  Pressure and throbbing   Severity:  Mild   Duration:  4 days   Timing:  Constant Chronicity:  New Context: not diabetes, not immunosuppression and not injected drug use   Relieved by:  Nothing Worsened by:  Nothing tried Ineffective treatments:  Oral antibiotics Associated symptoms: no fever, no headaches and no vomiting      History reviewed. No pertinent past medical history. Past Surgical History  Procedure Laterality Date  . Knee surgery     No family history on file. Social History  Substance Use Topics  . Smoking status: Former Games developer  . Smokeless tobacco: Never Used  . Alcohol Use: No    Review of Systems  Constitutional: Negative for fever and chills.  HENT: Negative for congestion and facial swelling.   Eyes: Negative for discharge and visual disturbance.  Respiratory: Negative for shortness of breath.   Cardiovascular: Negative for chest pain and palpitations.  Gastrointestinal: Negative for vomiting, abdominal pain and diarrhea.  Musculoskeletal: Negative for myalgias and arthralgias.  Skin: Positive for wound. Negative for color change and rash.  Neurological: Negative for tremors, syncope and headaches.  Psychiatric/Behavioral: Negative for confusion and dysphoric mood.      Allergies  Codeine  Home Medications   Prior to Admission medications    Medication Sig Start Date End Date Taking? Authorizing Provider  amoxicillin-clavulanate (AUGMENTIN) 875-125 MG per tablet Take 1 tablet by mouth 2 (two) times daily. One po bid x 7 days 09/04/12   Donnetta Hutching, MD  HYDROcodone-acetaminophen (NORCO/VICODIN) 5-325 MG per tablet Take one tab po q 4-6 hrs prn pain 04/20/15   Tammy Triplett, PA-C  ibuprofen (ADVIL,MOTRIN) 800 MG tablet Take 1 tablet (800 mg total) by mouth 3 (three) times daily. 09/06/12   Jethro Bastos, NP  ondansetron (ZOFRAN) 4 MG tablet Take 1 tablet (4 mg total) by mouth every 6 (six) hours. 09/06/12   Jethro Bastos, NP  sulfamethoxazole-trimethoprim (BACTRIM DS,SEPTRA DS) 800-160 MG per tablet Take 1 tablet by mouth 2 (two) times daily. For 10 days 04/20/15 04/27/15  Tammy Triplett, PA-C   BP 180/88 mmHg  Pulse 94  Temp(Src) 98.7 F (37.1 C) (Oral)  Resp 20  Ht  (1.93 m)  Wt 215 lb (97.523 kg)  BMI 26.18 kg/m2  SpO2 98% Physical Exam  Constitutional: He is oriented to person, place, and time. He appears well-developed and well-nourished.  HENT:  Head: Normocephalic and atraumatic.  Eyes: EOM are normal. Pupils are equal, round, and reactive to light.  Neck: Normal range of motion. Neck supple. No JVD present.  Cardiovascular: Normal rate and regular rhythm.  Exam reveals no gallop and no friction rub.   No murmur heard. Pulmonary/Chest: No respiratory distress. He has no wheezes.  Abdominal: He exhibits no distension. There is no tenderness. There is no rebound and no guarding.  Musculoskeletal: Normal range  of motion. He exhibits no edema or tenderness.  Neurological: He is alert and oriented to person, place, and time.  Skin: No rash noted. There is erythema. No pallor.     Psychiatric: He has a normal mood and affect. His behavior is normal.    ED Course  INCISION AND DRAINAGE Date/Time: 04/23/2015 3:48 PM Performed by: Adela Lank Atara Paterson Authorized by: Melene Plan Consent: Verbal consent obtained. Risks and  benefits: risks, benefits and alternatives were discussed Consent given by: patient Required items: required blood products, implants, devices, and special equipment available Patient identity confirmed: verbally with patient Type: abscess Body area: lower extremity Location details: left leg Anesthesia: local infiltration Local anesthetic: lidocaine 1% with epinephrine Anesthetic total: 9 ml Patient sedated: no Scalpel size: 11 Incision type: single straight Incision depth: subcutaneous Complexity: simple Drainage: purulent Drainage amount: moderate Wound treatment: wound left open Patient tolerance: Patient tolerated the procedure well with no immediate complications   (including critical care time) Labs Review Labs Reviewed - No data to display  Imaging Review No results found. I have personally reviewed and evaluated these images and lab results as part of my medical decision-making.   EKG Interpretation None      Emergency Focused Ultrasound Exam Limited Ultrasound of Soft Tissue   Performed and interpreted by Dr. Adela Lank Indication: evaluation for infection or foreign body Transverse and Sagittal views of  are obtained in real time for the purposes of evaluation of skin and underlying soft tissues.  Findings:  heterogeneous fluid collection, with hyperemia/edema of surrounding tissue Interpretation:  abscess, with surrounding cellulitis Images archived electronically.  CPT Codes:  Lower extremity [id number]    MDM   Final diagnoses:  Abscess  Cellulitis of left lower extremity    44 yo M with a chief complaint of cellulitis comes back for a wound recheck. Was seen 3 days ago with no noted fluctuance with a large area of erythema. Started on Bactrim with improvement of erythema. Patient with worsening pain over the past 48 hours. Raised area with mild fluctuance. Confirmed abscess on ultrasound. I&D in the ED. Continue Bactrim to finish course  3:50 PM:  I  have discussed the diagnosis/risks/treatment options with the patient and family and believe the pt to be eligible for discharge home to follow-up with PCP or here in 48 hours for recheck. We also discussed returning to the ED immediately if new or worsening sx occur. We discussed the sx which are most concerning (e.g., fever, chills closure of wound with worsening) that necessitate immediate return. Medications administered to the patient during their visit and any new prescriptions provided to the patient are listed below.  Medications given during this visit Medications  povidone-iodine (BETADINE) 10 % external solution (not administered)  acetaminophen (TYLENOL) tablet 1,000 mg (1,000 mg Oral Given 04/23/15 1525)  ibuprofen (ADVIL,MOTRIN) tablet 800 mg (800 mg Oral Given 04/23/15 1525)  oxyCODONE (Oxy IR/ROXICODONE) immediate release tablet 5 mg (5 mg Oral Given 04/23/15 1525)  lidocaine-EPINEPHrine (XYLOCAINE-EPINEPHrine) 1 %-1:200000 (PF) injection 10 mL (10 mLs Intradermal Given by Other 04/23/15 1529)    New Prescriptions   No medications on file     The patient appears reasonably screen and/or stabilized for discharge and I doubt any other medical condition or other [facility] requiring further screening, evaluation, or treatment in the ED at this time prior to discharge.     Melene Plan, DO 04/23/15 1550

## 2015-04-23 NOTE — ED Notes (Signed)
Pt reports here for recheck of spider bite on left leg.  Area of redness has lessened, area appears to have 2 small holes with drainage noted.

## 2015-04-23 NOTE — ED Notes (Signed)
MD Floyd at bedside
